# Patient Record
Sex: Female | Born: 1955 | Race: Black or African American | Hispanic: No | Marital: Single | Smoking: Former smoker
Health system: Southern US, Community
[De-identification: ages and names within clinical notes are randomized; demographics above are authoritative.]

## PROBLEM LIST (undated history)

## (undated) DIAGNOSIS — I10 Essential (primary) hypertension: Secondary | ICD-10-CM

## (undated) DIAGNOSIS — F341 Dysthymic disorder: Secondary | ICD-10-CM

## (undated) DIAGNOSIS — D1739 Benign lipomatous neoplasm of skin and subcutaneous tissue of other sites: Secondary | ICD-10-CM

## (undated) DIAGNOSIS — E8881 Metabolic syndrome: Secondary | ICD-10-CM

## (undated) DIAGNOSIS — F419 Anxiety disorder, unspecified: Secondary | ICD-10-CM

## (undated) DIAGNOSIS — E785 Hyperlipidemia, unspecified: Secondary | ICD-10-CM

## (undated) DIAGNOSIS — L989 Disorder of the skin and subcutaneous tissue, unspecified: Secondary | ICD-10-CM

## (undated) DIAGNOSIS — I471 Supraventricular tachycardia, unspecified: Secondary | ICD-10-CM

## (undated) DIAGNOSIS — E669 Obesity, unspecified: Secondary | ICD-10-CM

## (undated) DIAGNOSIS — M79609 Pain in unspecified limb: Secondary | ICD-10-CM

## (undated) DIAGNOSIS — G4700 Insomnia, unspecified: Secondary | ICD-10-CM

## (undated) DIAGNOSIS — IMO0001 Reserved for inherently not codable concepts without codable children: Secondary | ICD-10-CM

## (undated) DIAGNOSIS — M949 Disorder of cartilage, unspecified: Secondary | ICD-10-CM

## (undated) DIAGNOSIS — A6009 Herpesviral infection of other urogenital tract: Secondary | ICD-10-CM

## (undated) DIAGNOSIS — E559 Vitamin D deficiency, unspecified: Secondary | ICD-10-CM

## (undated) DIAGNOSIS — R9431 Abnormal electrocardiogram [ECG] [EKG]: Secondary | ICD-10-CM

## (undated) DIAGNOSIS — R062 Wheezing: Secondary | ICD-10-CM

## (undated) DIAGNOSIS — M899 Disorder of bone, unspecified: Secondary | ICD-10-CM

## (undated) DIAGNOSIS — J309 Allergic rhinitis, unspecified: Secondary | ICD-10-CM

## (undated) DIAGNOSIS — F17200 Nicotine dependence, unspecified, uncomplicated: Secondary | ICD-10-CM

## (undated) HISTORY — DX: Supraventricular tachycardia, unspecified: I47.10

## (undated) HISTORY — DX: Metabolic syndrome: E88.81

## (undated) HISTORY — DX: Essential (primary) hypertension: I10

## (undated) HISTORY — DX: Supraventricular tachycardia: I47.1

## (undated) HISTORY — DX: Disorder of bone, unspecified: M89.9

## (undated) HISTORY — DX: Hyperlipidemia, unspecified: E78.5

## (undated) HISTORY — DX: Reserved for inherently not codable concepts without codable children: IMO0001

## (undated) HISTORY — DX: Metabolic syndrome: E88.810

## (undated) HISTORY — DX: Disorder of the skin and subcutaneous tissue, unspecified: L98.9

## (undated) HISTORY — DX: Disorder of cartilage, unspecified: M94.9

## (undated) HISTORY — DX: Obesity, unspecified: E66.9

## (undated) HISTORY — DX: Wheezing: R06.2

## (undated) HISTORY — PX: ABDOMINAL HYSTERECTOMY: SHX81

## (undated) HISTORY — DX: Herpesviral infection of other urogenital tract: A60.09

## (undated) HISTORY — DX: Insomnia, unspecified: G47.00

## (undated) HISTORY — DX: Benign lipomatous neoplasm of skin and subcutaneous tissue of other sites: D17.39

## (undated) HISTORY — DX: Nicotine dependence, unspecified, uncomplicated: F17.200

## (undated) HISTORY — DX: Pain in unspecified limb: M79.609

## (undated) HISTORY — DX: Vitamin D deficiency, unspecified: E55.9

## (undated) HISTORY — DX: Dysthymic disorder: F34.1

## (undated) HISTORY — DX: Anxiety disorder, unspecified: F41.9

## (undated) HISTORY — DX: Allergic rhinitis, unspecified: J30.9

## (undated) HISTORY — DX: Abnormal electrocardiogram (ECG) (EKG): R94.31

## (undated) HISTORY — PX: TOTAL ABDOMINAL HYSTERECTOMY W/ BILATERAL SALPINGOOPHORECTOMY: SHX83

---

## 2008-07-30 ENCOUNTER — Ambulatory Visit: Payer: Self-pay | Admitting: Family Medicine

## 2009-08-10 DIAGNOSIS — E559 Vitamin D deficiency, unspecified: Secondary | ICD-10-CM

## 2009-09-14 ENCOUNTER — Ambulatory Visit: Payer: Self-pay | Admitting: Family Medicine

## 2009-09-17 ENCOUNTER — Ambulatory Visit: Payer: Self-pay | Admitting: Family Medicine

## 2009-12-22 LAB — HM DEXA SCAN

## 2010-03-21 LAB — HM COLONOSCOPY: HM Colonoscopy: NORMAL

## 2010-04-07 ENCOUNTER — Ambulatory Visit: Payer: Self-pay | Admitting: Gastroenterology

## 2010-04-29 ENCOUNTER — Ambulatory Visit: Payer: Self-pay | Admitting: Family Medicine

## 2010-10-06 ENCOUNTER — Ambulatory Visit: Payer: Self-pay | Admitting: Family Medicine

## 2012-05-01 ENCOUNTER — Ambulatory Visit: Payer: Self-pay | Admitting: Family Medicine

## 2013-07-18 ENCOUNTER — Ambulatory Visit: Payer: Self-pay | Admitting: Family Medicine

## 2014-03-17 ENCOUNTER — Emergency Department: Payer: Self-pay | Admitting: Emergency Medicine

## 2014-03-17 LAB — COMPREHENSIVE METABOLIC PANEL
ALT: 28 U/L (ref 12–78)
AST: 27 U/L (ref 15–37)
Albumin: 3.8 g/dL (ref 3.4–5.0)
Alkaline Phosphatase: 48 U/L
Anion Gap: 7 (ref 7–16)
BUN: 19 mg/dL — AB (ref 7–18)
Bilirubin,Total: 0.5 mg/dL (ref 0.2–1.0)
Calcium, Total: 9 mg/dL (ref 8.5–10.1)
Chloride: 110 mmol/L — ABNORMAL HIGH (ref 98–107)
Co2: 24 mmol/L (ref 21–32)
Creatinine: 1.34 mg/dL — ABNORMAL HIGH (ref 0.60–1.30)
EGFR (African American): 52 — ABNORMAL LOW
EGFR (Non-African Amer.): 44 — ABNORMAL LOW
GLUCOSE: 92 mg/dL (ref 65–99)
OSMOLALITY: 283 (ref 275–301)
Potassium: 4 mmol/L (ref 3.5–5.1)
Sodium: 141 mmol/L (ref 136–145)
Total Protein: 7.1 g/dL (ref 6.4–8.2)

## 2014-03-17 LAB — CK TOTAL AND CKMB (NOT AT ARMC)
CK, TOTAL: 168 U/L
CK-MB: 1.3 ng/mL (ref 0.5–3.6)

## 2014-03-17 LAB — CBC
HCT: 42.8 % (ref 35.0–47.0)
HGB: 14 g/dL (ref 12.0–16.0)
MCH: 28.4 pg (ref 26.0–34.0)
MCHC: 32.7 g/dL (ref 32.0–36.0)
MCV: 87 fL (ref 80–100)
PLATELETS: 227 10*3/uL (ref 150–440)
RBC: 4.94 10*6/uL (ref 3.80–5.20)
RDW: 14.6 % — AB (ref 11.5–14.5)
WBC: 6.6 10*3/uL (ref 3.6–11.0)

## 2014-03-17 LAB — TROPONIN I
TROPONIN-I: 0.05 ng/mL
Troponin-I: 0.09 ng/mL — ABNORMAL HIGH

## 2014-03-18 ENCOUNTER — Encounter: Payer: Self-pay | Admitting: *Deleted

## 2014-03-18 ENCOUNTER — Encounter (INDEPENDENT_AMBULATORY_CARE_PROVIDER_SITE_OTHER): Payer: Self-pay

## 2014-03-18 ENCOUNTER — Encounter: Payer: Self-pay | Admitting: Cardiovascular Disease

## 2014-03-18 ENCOUNTER — Ambulatory Visit (INDEPENDENT_AMBULATORY_CARE_PROVIDER_SITE_OTHER): Payer: No Typology Code available for payment source | Admitting: Cardiovascular Disease

## 2014-03-18 VITALS — BP 116/78 | HR 70 | Ht 69.0 in | Wt 214.5 lb

## 2014-03-18 DIAGNOSIS — R079 Chest pain, unspecified: Secondary | ICD-10-CM

## 2014-03-18 DIAGNOSIS — I10 Essential (primary) hypertension: Secondary | ICD-10-CM

## 2014-03-18 DIAGNOSIS — I498 Other specified cardiac arrhythmias: Secondary | ICD-10-CM

## 2014-03-18 DIAGNOSIS — I471 Supraventricular tachycardia, unspecified: Secondary | ICD-10-CM

## 2014-03-18 MED ORDER — ATORVASTATIN CALCIUM 40 MG PO TABS: 20.0000 mg | ORAL_TABLET | Freq: Every day | ORAL | Status: DC

## 2014-03-18 MED ORDER — OLMESARTAN MEDOXOMIL-HCTZ 20-12.5 MG PO TABS: 1.0000 | ORAL_TABLET | Freq: Every day | ORAL | Status: DC

## 2014-03-18 MED ORDER — DILTIAZEM HCL ER COATED BEADS 120 MG PO CP24: 120.0000 mg | ORAL_CAPSULE | Freq: Every day | ORAL | Status: DC

## 2014-03-18 NOTE — Assessment & Plan Note (Signed)
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 I discussed the natural history and management. We have to make sure there is no evidence of structural heart disease. Thus, I ordered an echocardiogram. Given that this is her first episode, it is reasonable to try medical therapy. If she fails medications, catheter ablation can be considered. I also explained vagal maneuvers to her today to terminate the tachycardia if it happens. In terms of medications, I recommend diltiazem extended release 120 mg once daily.

## 2014-03-18 NOTE — Assessment & Plan Note (Signed)
Diltiazem will be added for SVT as outlined above. Thus, I discontinued amlodipine so that she is not on 2 calcium channel blockers .

## 2014-03-18 NOTE — Patient Instructions (Addendum)
Your physician recommends that you schedule a follow-up appointment in:  1 month   Your physician has recommended you make the following change in your medication:  Stop Tribenzor  Start Benicar HCT 20/12.5 Start Diltiazem 120 mg daily  Decrease Atorvastatin to 20 mg daily   Your physician has requested that you have an echocardiogram. Echocardiography is a painless test that uses sound waves to create images of your heart. It provides your doctor with information about the size and shape of your heart and how well your heart's chambers and valves are working. This procedure takes approximately one hour. There are no restrictions for this procedure.

## 2014-03-18 NOTE — Progress Notes (Signed)
Primary care physician: Dr. Ancil Boozer  HPI  This is a pleasant 58 year old female who was referred for evaluation of supraventricular tachycardia. She has known history of hypertension and hyperlipidemia. Yesterday in the morning after she woke up, she started having palpitations associated with fatigue, sweating and dizziness. She went to work and from there she was directed to seek medical attention. She saw Dr. Ancil Boozer and was found to have a heart rate of 170 beats per minute. EKG showed supraventricular tachycardia. EMS were called. The patient was given adenosine in clinic and converted to sinus rhythm. She was taken to the emergency room at [facility]. Labs showed a troponin of 0.09 which was borderline. Basic metabolic profile showed a creatinine of 1.34 and BUN of 19. Electrolytes were unremarkable. CBC was normal. Chest x-ray shows no acute abnormalities. She reports no previous episodes of tachycardia or documented arrhythmia. She denies any exertional chest pain or shortness of breath prior to this episode. No orthopnea, PND or lower extremity edema. She is a smoker. No excessive caffeine use. No family history of premature coronary artery disease or sudden death.  Allergies  Allergen Reactions  . Acetaminophen      No current outpatient prescriptions on file prior to visit.   No current facility-administered medications on file prior to visit.     Past Medical History  Diagnosis Date  . Other and unspecified hyperlipidemia   . Dysmetabolic syndrome X   . Pain in limb   . Dysthymic disorder   . Essential hypertension, benign   . Insomnia, unspecified   . Allergic rhinitis, cause unspecified   . Unspecified disorder of skin and subcutaneous tissue   . Wheezing   . Obesity, unspecified   . Tobacco use disorder   . Myalgia and myositis, unspecified   . Lipoma of other skin and subcutaneous tissue   . Unspecified vitamin D deficiency   . Disorder of bone and cartilage, unspecified    . Nonspecific abnormal electrocardiogram (ECG) (EKG)   . Heart murmur   . SVT (supraventricular tachycardia)      Past Surgical History  Procedure Laterality Date  . Total abdominal hysterectomy w/ bilateral salpingoophorectomy       Family History  Problem Relation Age of Onset  . Breast cancer Mother   . Hyperlipidemia Mother      History   Social History  . Marital Status: Single    Spouse Name: N/A    Number of Children: N/A  . Years of Education: N/A   Occupational History  . Not on file.   Social History Main Topics  . Smoking status: Current Every Day Smoker -- 0.25 packs/day for 4 years  . Smokeless tobacco: Not on file  . Alcohol Use: Yes     Comment: occasional  . Drug Use: No  . Sexual Activity: Not on file   Other Topics Concern  . Not on file   Social History Narrative  . No narrative on file     ROS A 10 point review of system was performed. It is negative other than that mentioned in the history of present illness.   PHYSICAL EXAM   BP 116/78  Pulse 70  Ht 5\' 9"  (1.753 m)  Wt 214 lb 8 oz (97.297 kg)  BMI 31.66 kg/m2 Constitutional: She is oriented to person, place, and time. She appears well-developed and well-nourished. No distress.  HENT: No nasal discharge.  Head: Normocephalic and atraumatic.  Eyes: Pupils are equal and round. No discharge.  Neck: Normal range of motion. Neck supple. No JVD present. No thyromegaly present.  Cardiovascular: Normal rate, regular rhythm, normal heart sounds. Exam reveals no gallop and no friction rub. No murmur heard.  Pulmonary/Chest: Effort normal and breath sounds normal. No stridor. No respiratory distress. She has no wheezes. She has no rales. She exhibits no tenderness.  Abdominal: Soft. Bowel sounds are normal. She exhibits no distension. There is no tenderness. There is no rebound and no guarding.  Musculoskeletal: Normal range of motion. She exhibits no edema and no tenderness.    Neurological: She is alert and oriented to person, place, and time. Coordination normal.  Skin: Skin is warm and dry. No rash noted. She is not diaphoretic. No erythema. No pallor.  Psychiatric: She has a normal mood and affect. Her behavior is normal. Judgment and thought content normal.     EKG: Normal sinus rhythm with nonspecific T wave changes.   ASSESSMENT AND PLAN

## 2014-03-21 ENCOUNTER — Telehealth: Payer: Self-pay | Admitting: *Deleted

## 2014-03-21 NOTE — Telephone Encounter (Signed)
Patient called to make sure her new rx was sent to [facility]  Confirmed RX was sent  Patient asked for Benicar discount card to be placed at front desk

## 2014-03-24 ENCOUNTER — Other Ambulatory Visit: Payer: Self-pay

## 2014-03-24 ENCOUNTER — Telehealth: Payer: Self-pay

## 2014-03-24 MED ORDER — METOPROLOL TARTRATE 25 MG PO TABS: 25.0000 mg | ORAL_TABLET | Freq: Two times a day (BID) | ORAL | Status: DC

## 2014-03-24 MED ORDER — DILTIAZEM HCL ER COATED BEADS 120 MG PO CP24: 120.0000 mg | ORAL_CAPSULE | Freq: Every day | ORAL | Status: DC

## 2014-03-24 NOTE — Telephone Encounter (Signed)
Informed patient that her Diltiazem order was resent to [facility].

## 2014-03-24 NOTE — Telephone Encounter (Signed)
Pt called and states she still has not gotten her medication Dialtiazem. Please call.

## 2014-03-24 NOTE — Telephone Encounter (Signed)
New Rx metoprolol tart 25 mg take one tablet twice a day.

## 2014-03-25 ENCOUNTER — Telehealth: Payer: Self-pay

## 2014-03-25 ENCOUNTER — Other Ambulatory Visit (INDEPENDENT_AMBULATORY_CARE_PROVIDER_SITE_OTHER): Payer: No Typology Code available for payment source

## 2014-03-25 ENCOUNTER — Other Ambulatory Visit: Payer: Self-pay

## 2014-03-25 DIAGNOSIS — I471 Supraventricular tachycardia, unspecified: Secondary | ICD-10-CM

## 2014-03-25 DIAGNOSIS — R0602 Shortness of breath: Secondary | ICD-10-CM

## 2014-03-25 DIAGNOSIS — R079 Chest pain, unspecified: Secondary | ICD-10-CM

## 2014-03-25 NOTE — Telephone Encounter (Signed)
Spoke with patient regarding the change in her diltiazem since she can't swallow the diltiazem capsule. Dr. Fletcher Anon would like the patient to stop the diltiazem and start on Metoprolol Tart 25 mg take one tablet twice a day. A new Rx was sent to her pharmacy for metoprolol tart 25 mg take one tablet twice a day. The patient understands and will follow the instructions given regarding her medications.

## 2014-04-15 ENCOUNTER — Encounter: Payer: Self-pay | Admitting: Cardiovascular Disease

## 2014-04-15 ENCOUNTER — Ambulatory Visit (INDEPENDENT_AMBULATORY_CARE_PROVIDER_SITE_OTHER): Payer: No Typology Code available for payment source | Admitting: Cardiovascular Disease

## 2014-04-15 VITALS — BP 132/84 | HR 64 | Ht 69.0 in | Wt 215.5 lb

## 2014-04-15 DIAGNOSIS — E785 Hyperlipidemia, unspecified: Secondary | ICD-10-CM | POA: Insufficient documentation

## 2014-04-15 DIAGNOSIS — I10 Essential (primary) hypertension: Secondary | ICD-10-CM

## 2014-04-15 DIAGNOSIS — I471 Supraventricular tachycardia: Secondary | ICD-10-CM

## 2014-04-15 MED ORDER — ATORVASTATIN CALCIUM 20 MG PO TABS: 20.0000 mg | ORAL_TABLET | Freq: Every day | ORAL | Status: DC

## 2014-04-15 NOTE — Assessment & Plan Note (Signed)
Blood pressure is reasonably controlled on current medications. 

## 2014-04-15 NOTE — Assessment & Plan Note (Signed)
Continue treatment with metoprolol 25 mg twice daily. She is complaining of fatigue. If this continues to be an issue, we can consider decreasing the dose to 12.5 mg twice daily. If she develops recurrent episodes of SVT, catheter ablation is recommended.

## 2014-04-15 NOTE — Progress Notes (Signed)
Primary care physician: Dr. Ancil Boozer  HPI  This is a pleasant 58 year old female who is here today for followup visit regarding paroxysmal  supraventricular tachycardia. She has known history of hypertension and hyperlipidemia. She was seen recently for a documented episode of supraventricular tachycardia. She was started on diltiazem extended release but could not swallow a capsule. Thus, I switched her to metoprolol tartrate 25 mg twice daily. An echocardiogram was done which showed normal LV systolic function with mild grade 1 diastolic dysfunction. No significant valvular abnormalities. She has been doing well and denies any further episodes of tachycardia. No chest discomfort. She does complain of fatigue with metoprolol.  Allergies  Allergen Reactions  . Acetaminophen      Current Outpatient Prescriptions on File Prior to Visit  Medication Sig Dispense Refill  . ALPRAZolam (XANAX) 0.5 MG tablet Take 0.5 mg by mouth at bedtime as needed for anxiety.      Marland Kitchen buPROPion (WELLBUTRIN XL) 150 MG 24 hr tablet Take 150 mg by mouth daily.      . metoprolol tartrate (LOPRESSOR) 25 MG tablet Take 1 tablet (25 mg total) by mouth 2 (two) times daily.  180 tablet  3  . naproxen sodium (ANAPROX) 220 MG tablet Take 220 mg by mouth as needed.      Marland Kitchen olmesartan-hydrochlorothiazide (BENICAR HCT) 20-12.5 MG per tablet Take 1 tablet by mouth daily.  90 tablet  3   No current facility-administered medications on file prior to visit.     Past Medical History  Diagnosis Date  . Other and unspecified hyperlipidemia   . Dysmetabolic syndrome X   . Pain in limb   . Dysthymic disorder   . Insomnia, unspecified   . Allergic rhinitis, cause unspecified   . Unspecified disorder of skin and subcutaneous tissue   . Wheezing   . Obesity, unspecified   . Tobacco use disorder   . Myalgia and myositis, unspecified   . Lipoma of other skin and subcutaneous tissue   . Unspecified vitamin D deficiency   .  Disorder of bone and cartilage, unspecified   . Nonspecific abnormal electrocardiogram (ECG) (EKG)   . SVT (supraventricular tachycardia)   . Essential hypertension, benign      Past Surgical History  Procedure Laterality Date  . Total abdominal hysterectomy w/ bilateral salpingoophorectomy       Family History  Problem Relation Age of Onset  . Breast cancer Mother   . Hyperlipidemia Mother      History   Social History  . Marital Status: Single    Spouse Name: N/A    Number of Children: N/A  . Years of Education: N/A   Occupational History  . Not on file.   Social History Main Topics  . Smoking status: Current Every Day Smoker -- 0.25 packs/day for 4 years  . Smokeless tobacco: Not on file  . Alcohol Use: Yes     Comment: occasional  . Drug Use: No  . Sexual Activity: Not on file   Other Topics Concern  . Not on file   Social History Narrative  . No narrative on file     ROS A 10 point review of system was performed. It is negative other than that mentioned in the history of present illness.   PHYSICAL EXAM   BP 132/84  Pulse 64  Ht 5\' 9"  (1.753 m)  Wt 215 lb 8 oz (97.75 kg)  BMI 31.81 kg/m2 Constitutional: She is oriented to person, place, and time.  She appears well-developed and well-nourished. No distress.  HENT: No nasal discharge.  Head: Normocephalic and atraumatic.  Eyes: Pupils are equal and round. No discharge.  Neck: Normal range of motion. Neck supple. No JVD present. No thyromegaly present.  Cardiovascular: Normal rate, regular rhythm, normal heart sounds. Exam reveals no gallop and no friction rub. No murmur heard.  Pulmonary/Chest: Effort normal and breath sounds normal. No stridor. No respiratory distress. She has no wheezes. She has no rales. She exhibits no tenderness.  Abdominal: Soft. Bowel sounds are normal. She exhibits no distension. There is no tenderness. There is no rebound and no guarding.  Musculoskeletal: Normal range of  motion. She exhibits no edema and no tenderness.  Neurological: She is alert and oriented to person, place, and time. Coordination normal.  Skin: Skin is warm and dry. No rash noted. She is not diaphoretic. No erythema. No pallor.  Psychiatric: She has a normal mood and affect. Her behavior is normal. Judgment and thought content normal.     EKG: Normal sinus rhythm with nonspecific T wave changes.   ASSESSMENT AND PLAN

## 2014-04-15 NOTE — Patient Instructions (Signed)
Your physician has recommended you make the following change in your medication:  Decrease Atorvastatin to 20 mg once daily  Your physician wants you to follow-up in: 6 months. You will receive a reminder letter in the mail two months in advance. If you don't receive a letter, please call our office to schedule the follow-up appointment.

## 2014-04-15 NOTE — Assessment & Plan Note (Signed)
She has been taking atorvastatin twice daily by mistake. This was changed to once daily.

## 2014-04-17 ENCOUNTER — Ambulatory Visit: Payer: No Typology Code available for payment source | Admitting: Cardiovascular Disease

## 2014-07-09 LAB — HEMOGLOBIN A1C: Hgb A1c MFr Bld: 6 % (ref 4.0–6.0)

## 2014-07-09 LAB — LIPID PANEL
Cholesterol: 143 mg/dL (ref 0–200)
HDL: 48 mg/dL (ref 35–70)
LDL CALC: 81 mg/dL
Triglycerides: 71 mg/dL (ref 40–160)

## 2014-08-21 ENCOUNTER — Ambulatory Visit: Payer: Self-pay | Admitting: Family Medicine

## 2014-08-21 LAB — HM MAMMOGRAPHY: HM MAMMO: NORMAL

## 2014-08-27 ENCOUNTER — Ambulatory Visit: Payer: Self-pay | Admitting: Family Medicine

## 2014-10-09 ENCOUNTER — Ambulatory Visit (INDEPENDENT_AMBULATORY_CARE_PROVIDER_SITE_OTHER): Payer: No Typology Code available for payment source | Admitting: Cardiovascular Disease

## 2014-10-09 ENCOUNTER — Encounter: Payer: Self-pay | Admitting: Cardiovascular Disease

## 2014-10-09 VITALS — BP 112/76 | HR 52 | Ht 69.0 in | Wt 211.4 lb

## 2014-10-09 DIAGNOSIS — I10 Essential (primary) hypertension: Secondary | ICD-10-CM

## 2014-10-09 DIAGNOSIS — I471 Supraventricular tachycardia, unspecified: Secondary | ICD-10-CM

## 2014-10-09 NOTE — Assessment & Plan Note (Signed)
Blood pressure is controlled on current medications. 

## 2014-10-09 NOTE — Progress Notes (Signed)
Primary care physician: Dr. Ancil Boozer  HPI  This is a pleasant 58 year old female who is here today for followup visit regarding paroxysmal supraventricular tachycardia. She has known history of hypertension and hyperlipidemia. She was seen in 02/2014 for a documented episode of supraventricular tachycardia. She was started on diltiazem extended release but could not swallow a capsule. Thus, I switched her to metoprolol tartrate 25 mg twice daily. An echocardiogram was done which showed normal LV systolic function with mild grade 1 diastolic dysfunction. No significant valvular abnormalities. She has been doing well and denies any further episodes of tachycardia. No chest discomfort.  Generalized fatigue that was described with metoprolol initially has improved.   Allergies  Allergen Reactions  . Acetaminophen      Current Outpatient Prescriptions on File Prior to Visit  Medication Sig Dispense Refill  . ALPRAZolam (XANAX) 0.5 MG tablet Take 0.5 mg by mouth at bedtime as needed for anxiety.    Marland Kitchen atorvastatin (LIPITOR) 20 MG tablet Take 1 tablet (20 mg total) by mouth daily. 90 tablet 3  . buPROPion (WELLBUTRIN XL) 150 MG 24 hr tablet Take 150 mg by mouth daily.    . metoprolol tartrate (LOPRESSOR) 25 MG tablet Take 1 tablet (25 mg total) by mouth 2 (two) times daily. 180 tablet 3  . olmesartan-hydrochlorothiazide (BENICAR HCT) 20-12.5 MG per tablet Take 1 tablet by mouth daily. 90 tablet 3   No current facility-administered medications on file prior to visit.     Past Medical History  Diagnosis Date  . Other and unspecified hyperlipidemia   . Dysmetabolic syndrome X   . Pain in limb   . Dysthymic disorder   . Insomnia, unspecified   . Allergic rhinitis, cause unspecified   . Unspecified disorder of skin and subcutaneous tissue   . Wheezing   . Obesity, unspecified   . Tobacco use disorder   . Myalgia and myositis, unspecified   . Lipoma of other skin and subcutaneous tissue   .  Unspecified vitamin D deficiency   . Disorder of bone and cartilage, unspecified   . Nonspecific abnormal electrocardiogram (ECG) (EKG)   . SVT (supraventricular tachycardia)   . Essential hypertension, benign      Past Surgical History  Procedure Laterality Date  . Total abdominal hysterectomy w/ bilateral salpingoophorectomy       Family History  Problem Relation Age of Onset  . Breast cancer Mother   . Hyperlipidemia Mother      History   Social History  . Marital Status: Single    Spouse Name: N/A    Number of Children: N/A  . Years of Education: N/A   Occupational History  . Not on file.   Social History Main Topics  . Smoking status: Current Every Day Smoker -- 0.25 packs/day for 4 years  . Smokeless tobacco: Not on file  . Alcohol Use: Yes     Comment: occasional  . Drug Use: No  . Sexual Activity: Not on file   Other Topics Concern  . Not on file   Social History Narrative     ROS A 10 point review of system was performed. It is negative other than that mentioned in the history of present illness.   PHYSICAL EXAM   BP 112/76 mmHg  Pulse 52  Ht 5\' 9"  (1.753 m)  Wt 211 lb 6.4 oz (95.89 kg)  BMI 31.20 kg/m2 Constitutional: She is oriented to person, place, and time. She appears well-developed and well-nourished. No distress.  HENT: No nasal discharge.  Head: Normocephalic and atraumatic.  Eyes: Pupils are equal and round. No discharge.  Neck: Normal range of motion. Neck supple. No JVD present. No thyromegaly present.  Cardiovascular: Normal rate, regular rhythm, normal heart sounds. Exam reveals no gallop and no friction rub. No murmur heard.  Pulmonary/Chest: Effort normal and breath sounds normal. No stridor. No respiratory distress. She has no wheezes. She has no rales. She exhibits no tenderness.  Abdominal: Soft. Bowel sounds are normal. She exhibits no distension. There is no tenderness. There is no rebound and no guarding.    Musculoskeletal: Normal range of motion. She exhibits no edema and no tenderness.  Neurological: She is alert and oriented to person, place, and time. Coordination normal.  Skin: Skin is warm and dry. No rash noted. She is not diaphoretic. No erythema. No pallor.  Psychiatric: She has a normal mood and affect. Her behavior is normal. Judgment and thought content normal.     EKG: Normal sinus rhythm with nonspecific T wave changes.   ASSESSMENT AND PLAN

## 2014-10-09 NOTE — Patient Instructions (Signed)
Continue same medications.   Your physician wants you to follow-up in: 1 year.  You will receive a reminder letter in the mail two months in advance. If you don't receive a letter, please call our office to schedule the follow-up appointment.  

## 2014-10-09 NOTE — Assessment & Plan Note (Signed)
She has not had any arrhythmia since she was placed on metoprolol. I recommend continuing current treatment.

## 2014-11-25 ENCOUNTER — Telehealth: Payer: Self-pay

## 2014-11-25 NOTE — Telephone Encounter (Signed)
Notified patient samples available of Benicar 20/12.5 mg

## 2014-11-25 NOTE — Telephone Encounter (Signed)
Pt would like samples of Benicar.

## 2015-03-12 ENCOUNTER — Telehealth: Payer: Self-pay | Admitting: *Deleted

## 2015-03-12 MED ORDER — LOSARTAN POTASSIUM-HCTZ 50-12.5 MG PO TABS: 1.0000 | ORAL_TABLET | Freq: Every day | ORAL | Status: DC

## 2015-03-12 NOTE — Telephone Encounter (Signed)
Currently do no have any samples of Benicar 20-12.5 mg available. Spoke with Drug Rep and they will not be able to bring samples at this time. They mentioned that it would be a week before we would be able to get those samples.  Pt was unable to refill medication due to cost. Please advise.

## 2015-03-12 NOTE — Telephone Encounter (Signed)
Pt os asking for samples for Benicar  She went for a refill and it was a bit high to get, she is working with insurance company to reduce the price but wanted to ask if we had any. Please let patient know.

## 2015-03-12 NOTE — Telephone Encounter (Signed)
Spoke w/ pt.  Advised her of Dr. Tyrell Antonio recommendation.  She is agreeable and will call back w/ any questions or concerns.

## 2015-03-12 NOTE — Telephone Encounter (Signed)
I recommend switching to Losartan/HCTZ 50 /12.5 mg once daily which is generic.

## 2015-04-10 ENCOUNTER — Other Ambulatory Visit: Payer: Self-pay | Admitting: Cardiovascular Disease

## 2015-04-26 IMAGING — MG MM DIGITAL SCREENING BILAT W/ CAD
1 series · 4 of 4 positions shown · non-contrast
Comparison: Previous exam(s)

CLINICAL DATA: Screening.

EXAM:
DIGITAL SCREENING BILATERAL MAMMOGRAM WITH CAD

[R CC · right · 4 of 4 slices shown]
[im 1/4]
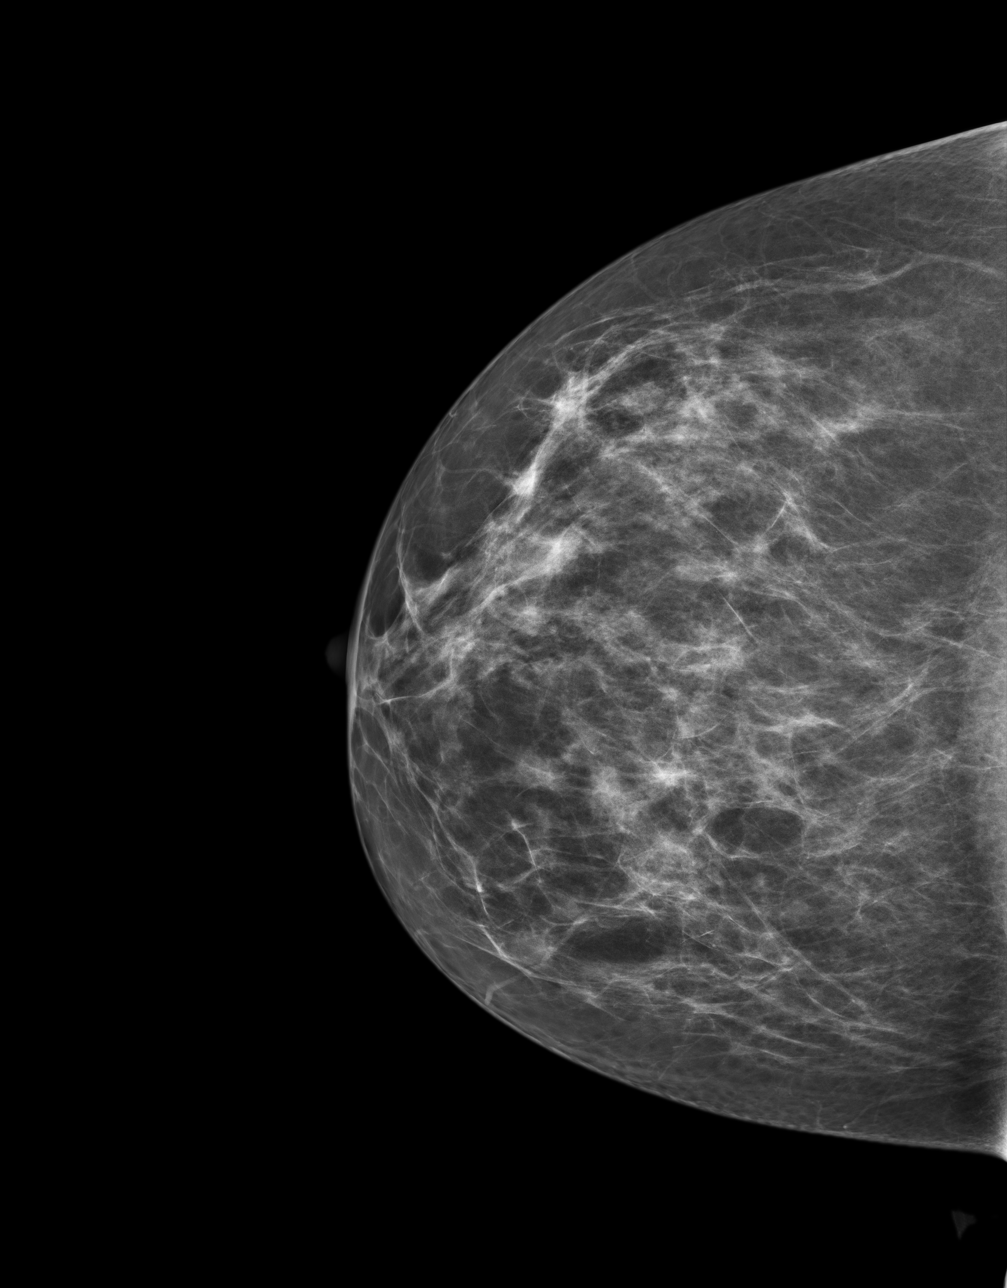
[im 2/4]
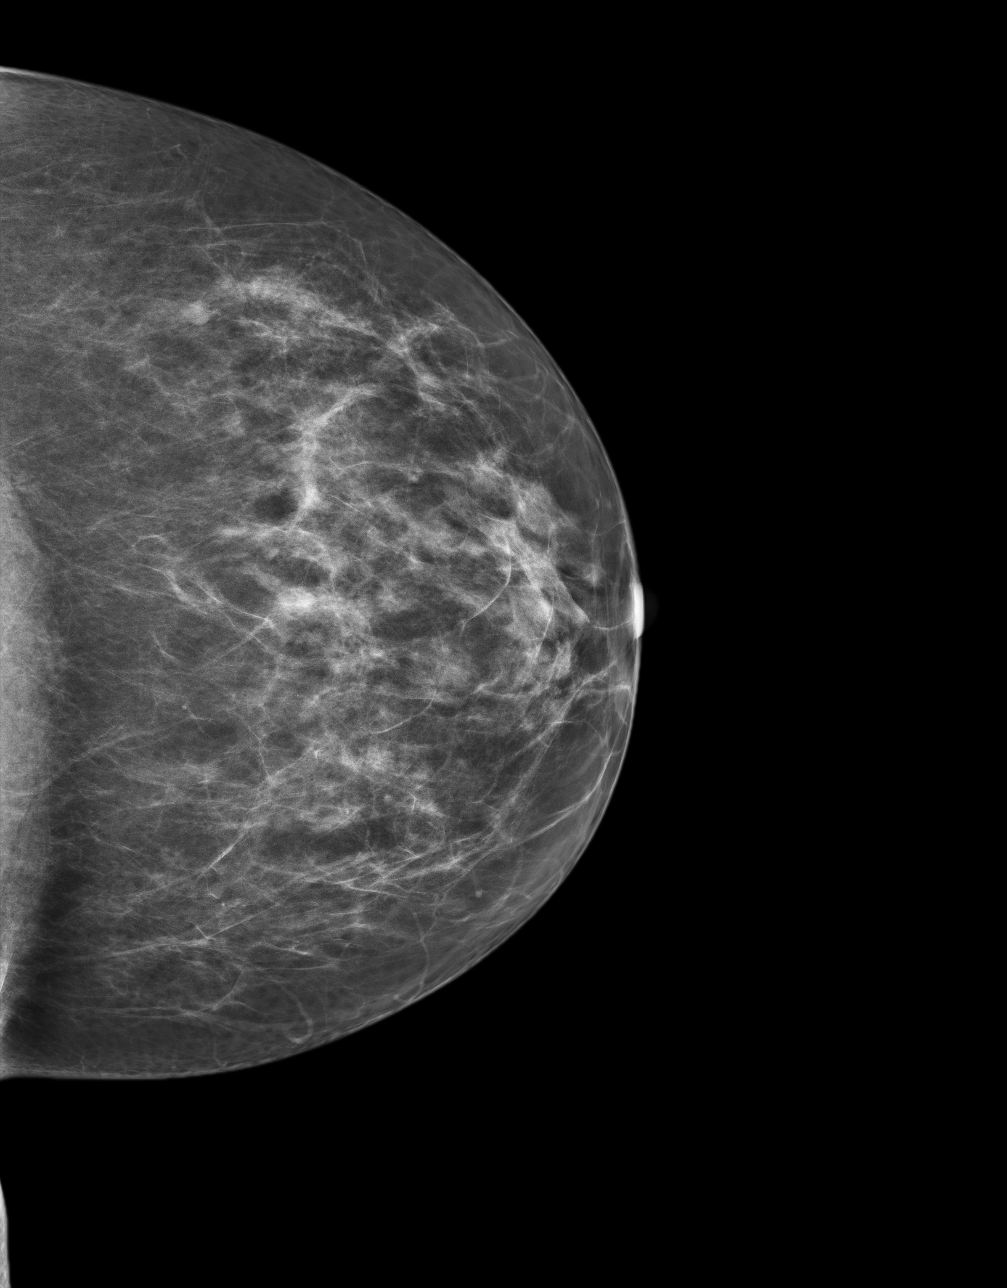
[im 3/4]
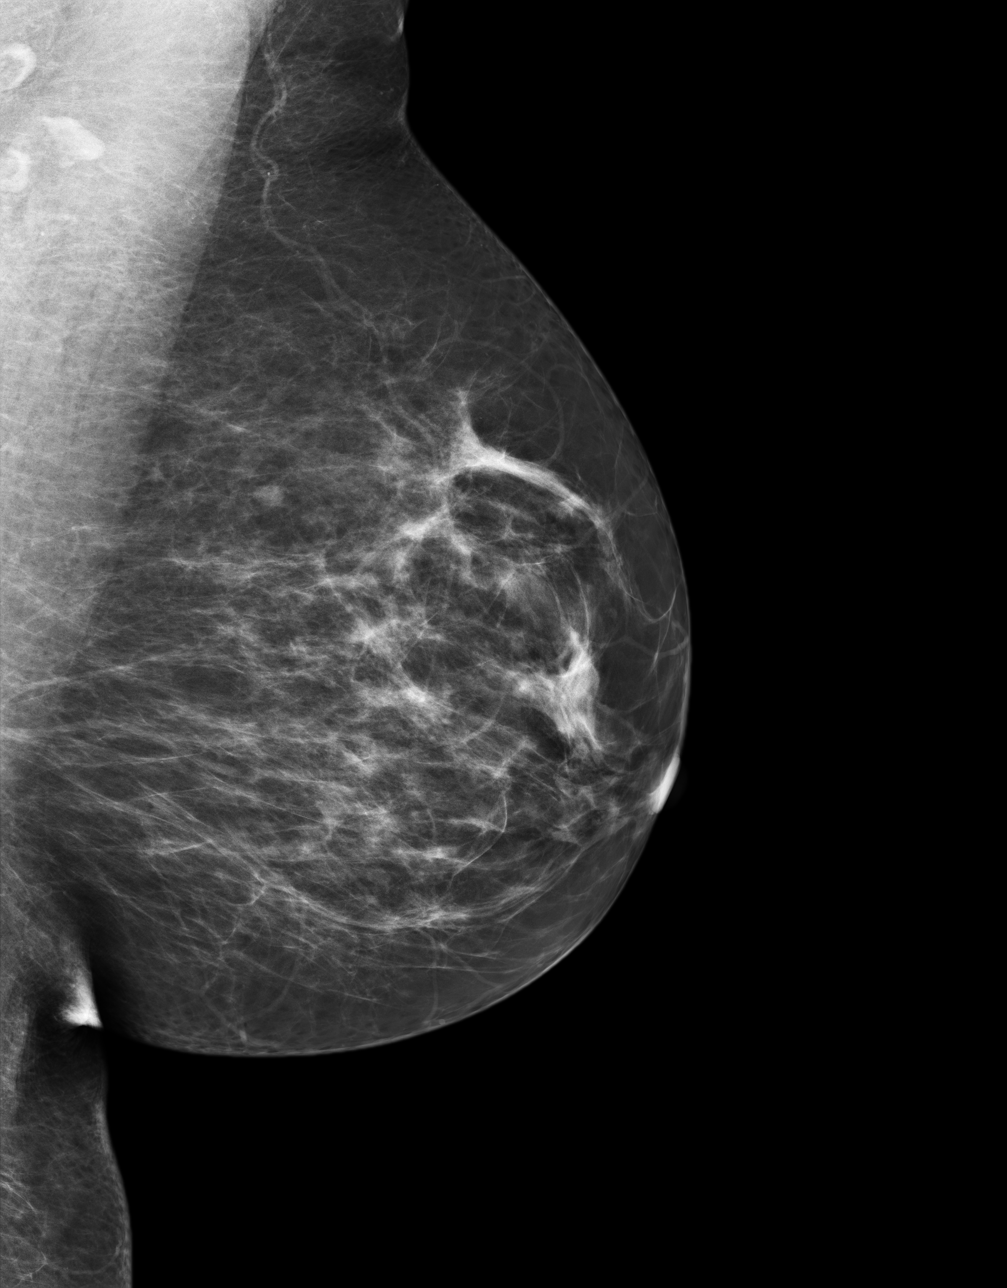
[im 4/4]
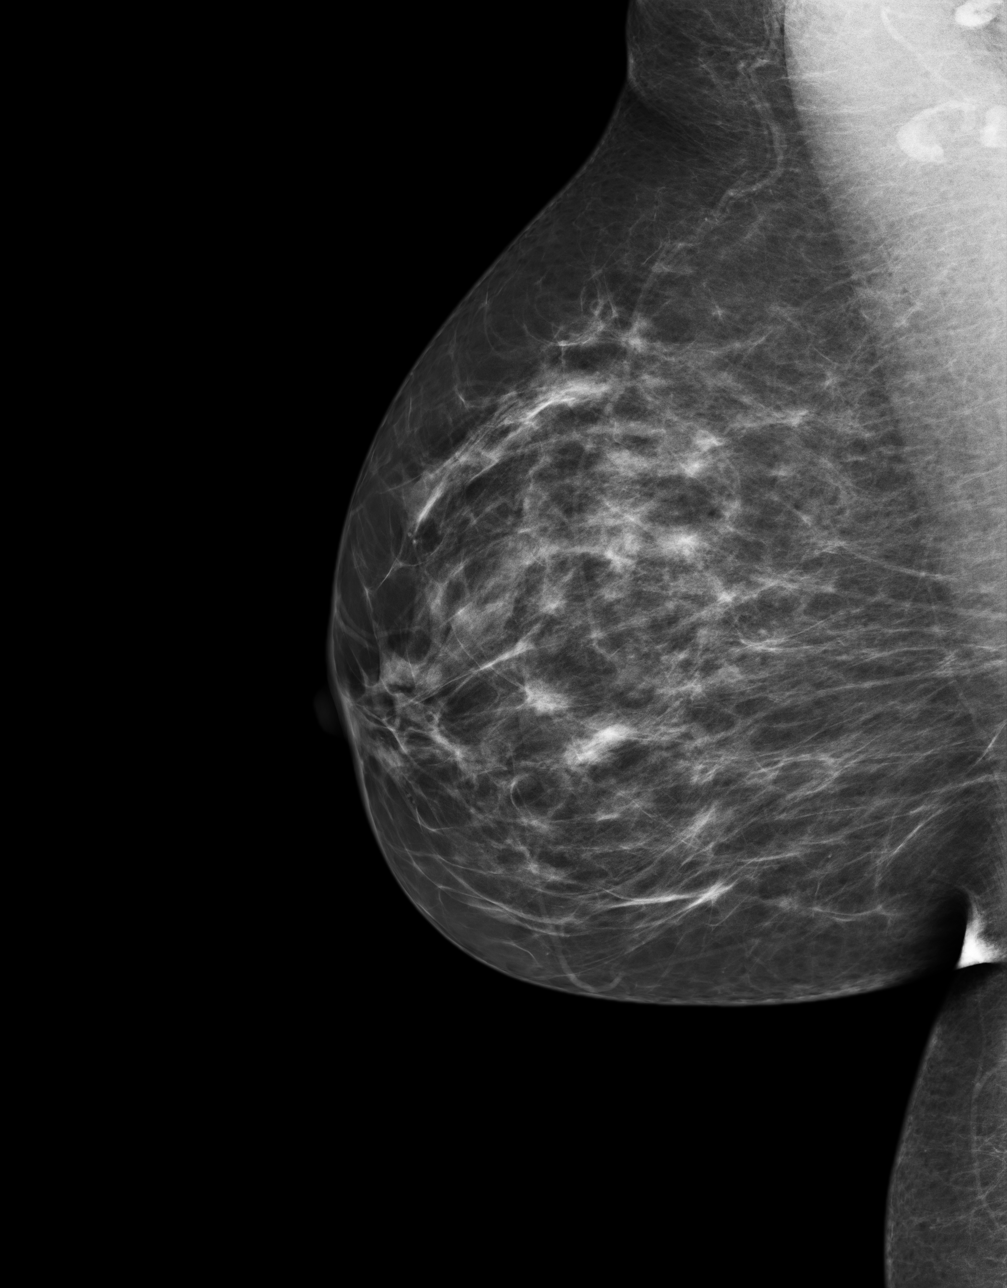

[4 of 4 positions shown; findings below may reference images not displayed]

ACR Breast Density Category b: There are scattered areas of
fibroglandular density.
FINDINGS: In the right breast, a possible mass warrants further evaluation
with spot compression views and possibly ultrasound. In the left
breast, no findings suspicious for malignancy. Images were processed
with CAD.
IMPRESSION: Further evaluation is suggested for possible mass in the right
breast.

RECOMMENDATION:
Diagnostic mammogram and possibly ultrasound of the right breast.
(Code:KG-2-55L)

The patient will be contacted regarding the findings, and additional
imaging will be scheduled.

BI-RADS CATEGORY  0: Incomplete. Need additional imaging evaluation
and/or prior mammograms for comparison.

## 2015-05-18 ENCOUNTER — Other Ambulatory Visit: Payer: Self-pay | Admitting: Family Medicine

## 2015-06-27 ENCOUNTER — Encounter: Payer: Self-pay | Admitting: Family Medicine

## 2015-06-27 DIAGNOSIS — M545 Low back pain, unspecified: Secondary | ICD-10-CM | POA: Insufficient documentation

## 2015-06-27 DIAGNOSIS — A6000 Herpesviral infection of urogenital system, unspecified: Secondary | ICD-10-CM | POA: Insufficient documentation

## 2015-06-27 DIAGNOSIS — I10 Essential (primary) hypertension: Secondary | ICD-10-CM | POA: Insufficient documentation

## 2015-06-27 DIAGNOSIS — R053 Chronic cough: Secondary | ICD-10-CM | POA: Insufficient documentation

## 2015-06-27 DIAGNOSIS — F329 Major depressive disorder, single episode, unspecified: Secondary | ICD-10-CM | POA: Insufficient documentation

## 2015-06-27 DIAGNOSIS — F419 Anxiety disorder, unspecified: Secondary | ICD-10-CM

## 2015-06-27 DIAGNOSIS — J309 Allergic rhinitis, unspecified: Secondary | ICD-10-CM | POA: Insufficient documentation

## 2015-06-27 DIAGNOSIS — E669 Obesity, unspecified: Secondary | ICD-10-CM | POA: Insufficient documentation

## 2015-06-27 DIAGNOSIS — L304 Erythema intertrigo: Secondary | ICD-10-CM | POA: Insufficient documentation

## 2015-06-27 DIAGNOSIS — M797 Fibromyalgia: Secondary | ICD-10-CM | POA: Insufficient documentation

## 2015-06-27 DIAGNOSIS — E8881 Metabolic syndrome: Secondary | ICD-10-CM | POA: Insufficient documentation

## 2015-06-27 DIAGNOSIS — Z87440 Personal history of urinary (tract) infections: Secondary | ICD-10-CM | POA: Insufficient documentation

## 2015-06-27 DIAGNOSIS — E785 Hyperlipidemia, unspecified: Secondary | ICD-10-CM | POA: Insufficient documentation

## 2015-06-27 DIAGNOSIS — R05 Cough: Secondary | ICD-10-CM | POA: Insufficient documentation

## 2015-06-27 DIAGNOSIS — G4700 Insomnia, unspecified: Secondary | ICD-10-CM | POA: Insufficient documentation

## 2015-06-27 DIAGNOSIS — R062 Wheezing: Secondary | ICD-10-CM | POA: Insufficient documentation

## 2015-07-02 ENCOUNTER — Ambulatory Visit: Payer: Self-pay | Admitting: Family Medicine

## 2015-07-02 ENCOUNTER — Other Ambulatory Visit: Payer: Self-pay | Admitting: Family Medicine

## 2015-07-02 NOTE — Telephone Encounter (Signed)
Patient requesting refill. 

## 2015-07-03 ENCOUNTER — Other Ambulatory Visit: Payer: Self-pay | Admitting: Family Medicine

## 2015-07-03 NOTE — Telephone Encounter (Signed)
Patient requesting refill. 

## 2015-07-03 NOTE — Telephone Encounter (Signed)
Patient will call back to sch the appointment

## 2015-08-21 ENCOUNTER — Telehealth: Payer: Self-pay

## 2015-08-21 NOTE — Telephone Encounter (Signed)
Notified patient we do not sample Losartan but I check to see if Dr. Rockey Situ can switch to another medication that is cheaper. Please advise if there is another medication she can try that would be cheaper than Losartan; the patient has no insurance at this time.

## 2015-08-21 NOTE — Telephone Encounter (Signed)
Pt states she has no insurance now and would like Losartan samples. Please call.

## 2015-08-24 NOTE — Telephone Encounter (Signed)
LMOM for patient to call back regarding medication change.

## 2015-08-24 NOTE — Telephone Encounter (Signed)
Will forward to Dr. Fletcher Anon

## 2015-08-24 NOTE — Telephone Encounter (Signed)
We can switch that to Lisinopril-HCTZ 20-12.5 mg daily . This is on the 4 $ list at [facility].

## 2015-08-25 ENCOUNTER — Other Ambulatory Visit: Payer: Self-pay | Admitting: *Deleted

## 2015-08-25 MED ORDER — LISINOPRIL-HYDROCHLOROTHIAZIDE 20-12.5 MG PO TABS: 1.0000 | ORAL_TABLET | Freq: Every day | ORAL | Status: AC

## 2015-08-25 NOTE — Telephone Encounter (Signed)
Pt would like to switch to Lisinopril HCTZ 20-12.5 MG #30 R#3 sent to local pharmacy.

## 2015-08-31 ENCOUNTER — Telehealth: Payer: Self-pay | Admitting: *Deleted

## 2015-08-31 NOTE — Telephone Encounter (Signed)
Left message on machine for patient to contact the office.   

## 2015-08-31 NOTE — Telephone Encounter (Signed)
Pt c/o medication issue:  1. Name of Medication:  Not sure what it is, can't remember changed from losartan to this new one.   2. How are you currently taking this medication (dosage and times per day)? She is stopped it since yesterday.   3. Are you having a reaction (difficulty breathing--STAT)? No   4. What is your medication issue?  Told by pharmacist that these side effect may happen  Only took medication 4 days   Coughing, dry mouth Chest hurting caused by coughing can't really sleep.

## 2015-09-04 NOTE — Telephone Encounter (Signed)
Left message on machine for patient to contact the office.   

## 2015-09-07 NOTE — Telephone Encounter (Signed)
Left message on machine for patient to contact the office.   

## 2015-10-05 ENCOUNTER — Ambulatory Visit: Payer: No Typology Code available for payment source | Attending: Oncology

## 2015-10-28 ENCOUNTER — Ambulatory Visit: Payer: Self-pay | Attending: Oncology | Admitting: *Deleted

## 2015-10-28 ENCOUNTER — Telehealth: Payer: Self-pay | Admitting: Cardiovascular Disease

## 2015-10-28 ENCOUNTER — Encounter: Payer: Self-pay | Admitting: *Deleted

## 2015-10-28 ENCOUNTER — Ambulatory Visit
Admission: RE | Admit: 2015-10-28 | Discharge: 2015-10-28 | Disposition: A | Discharge status: Home / Self Care | Payer: Self-pay | Source: Ambulatory Visit | Attending: Oncology | Admitting: Oncology

## 2015-10-28 VITALS — BP 140/88 | HR 61 | Temp 97.3°F | Ht 70.08 in | Wt 208.2 lb

## 2015-10-28 DIAGNOSIS — Z0000 Encounter for general adult medical examination without abnormal findings: Secondary | ICD-10-CM

## 2015-10-28 NOTE — Telephone Encounter (Signed)
3 attempts to schedule from recall list.  LMOV to call office.  Deleting recall.   °

## 2015-10-28 NOTE — Patient Instructions (Signed)
Gave patient hand-out, Women Staying Healthy, Active and Well from [facility], with education on breast health, pap smears, heart and colon health. 

## 2015-10-28 NOTE — Progress Notes (Signed)
Subjective:     Patient ID: Rhonda Burgess, female   DOB: 1956/08/05, 59 y.o.   MRN: [id number]  HPI   Review of Systems     Objective:   Physical Exam  Pulmonary/Chest: Right breast exhibits no inverted nipple, no mass, no nipple discharge, no skin change and no tenderness. Left breast exhibits no inverted nipple, no mass, no nipple discharge, no skin change and no tenderness. Breasts are symmetrical.  Abdominal: There is no splenomegaly or hepatomegaly.    Genitourinary: Rectal exam shows external hemorrhoid. No labial fusion. There is no rash, tenderness, lesion or injury on the right labia. There is no rash, tenderness or lesion on the left labia. No erythema or tenderness in the vagina. No foreign body around the vagina. No signs of injury around the vagina. No vaginal discharge found.  Soft brown stool noted on exam       Assessment:     59 year old 10 female presents to [facility] for clinical breast exam, pelvic exam and mammogram.  Clinical breast exam unremarkable.  Taught self breast awareness.  Pelvic exam without masses.  Patient has been screened for eligibility.  She does not have any insurance, Medicare or Medicaid.  She also meets financial eligibility.  Hand-out given on the Affordable Care Act.     Plan:     Screening mammogram ordered.  Will follow-up per [facility] protocol.

## 2015-10-29 ENCOUNTER — Encounter: Payer: Self-pay | Admitting: *Deleted

## 2015-10-29 NOTE — Progress Notes (Signed)
Letter mailed from [facility] to inform patient of her normal mammogram results.  Patient is to follow-up with annual screening in one year.  HSIS to [facility]. 

## 2018-08-27 ENCOUNTER — Encounter (INDEPENDENT_AMBULATORY_CARE_PROVIDER_SITE_OTHER): Payer: Self-pay

## 2018-08-27 ENCOUNTER — Ambulatory Visit
Admission: RE | Admit: 2018-08-27 | Discharge: 2018-08-27 | Disposition: A | Discharge status: Home / Self Care | Payer: Self-pay | Source: Ambulatory Visit | Attending: Oncology | Admitting: Oncology

## 2018-08-27 ENCOUNTER — Encounter: Payer: Self-pay | Admitting: *Deleted

## 2018-08-27 ENCOUNTER — Ambulatory Visit: Payer: Self-pay | Attending: Oncology | Admitting: *Deleted

## 2018-08-27 VITALS — BP 113/75 | HR 80 | Temp 97.1°F | Ht 73.0 in | Wt 243.0 lb

## 2018-08-27 DIAGNOSIS — Z0000 Encounter for general adult medical examination without abnormal findings: Secondary | ICD-10-CM | POA: Insufficient documentation

## 2018-08-27 NOTE — Patient Instructions (Signed)
Gave patient hand-out, Women Staying Healthy, Active and Well from [facility], with education on breast health, pap smears, heart and colon health. 

## 2018-08-27 NOTE — Progress Notes (Signed)
  Subjective:     Patient ID: Rhonda Burgess, female   DOB: Aug 28, 1956, 62 y.o.   MRN: [id number]  HPI   Review of Systems     Objective:   Physical Exam  Pulmonary/Chest: Right breast exhibits no inverted nipple, no mass, no nipple discharge, no skin change and no tenderness. Left breast exhibits no inverted nipple, no mass, no nipple discharge, no skin change and no tenderness. Breasts are asymmetrical.  Large pendulous breast.  Right breast larger than the left       Assessment:     62 year old Black female returns to [facility] for annual screening.  Clinical breast exam unremarkable.  Taught self breast awareness. Family history of breast cancer in her mam at age 38.  Patient with a history of hysterectomy for fibroids.  Patient has been screened for eligibility.  She does not have any insurance, Medicare or Medicaid.  She also meets financial eligibility.  Hand-out given on the Affordable Care Act.  Risk Assessment    Risk Scores      08/27/2018   Last edited by: Rico Junker, RN   5-year risk: 2.3 %   Lifetime risk: 10.8 %            Plan:     Screening mammogram ordered.  Will follow-up per B[facility]CP protocol.

## 2018-08-27 NOTE — Progress Notes (Signed)
Letter mailed from [facility] to inform patient of her normal mammogram results.  Patient is to follow-up with annual screening in one year.  HSIS to [facility]. 

## 2019-08-26 ENCOUNTER — Telehealth: Payer: Self-pay

## 2019-08-26 NOTE — Telephone Encounter (Signed)
Pre-screening call teempted prior to appointment with [facility] on 08/28/2019. No answer / left voicemail on mobile #.

## 2019-08-27 ENCOUNTER — Other Ambulatory Visit: Payer: Self-pay

## 2019-08-28 ENCOUNTER — Other Ambulatory Visit: Payer: Self-pay

## 2019-08-28 ENCOUNTER — Encounter: Payer: Self-pay | Admitting: *Deleted

## 2019-08-28 ENCOUNTER — Encounter (INDEPENDENT_AMBULATORY_CARE_PROVIDER_SITE_OTHER): Payer: Self-pay

## 2019-08-28 ENCOUNTER — Ambulatory Visit
Admission: RE | Admit: 2019-08-28 | Discharge: 2019-08-28 | Disposition: A | Discharge status: Home / Self Care | Payer: Self-pay | Source: Ambulatory Visit | Attending: Oncology | Admitting: Oncology

## 2019-08-28 ENCOUNTER — Ambulatory Visit: Payer: Self-pay | Attending: Oncology | Admitting: *Deleted

## 2019-08-28 VITALS — BP 152/86 | HR 56 | Temp 96.5°F | Ht 70.0 in | Wt 229.0 lb

## 2019-08-28 DIAGNOSIS — Z0000 Encounter for general adult medical examination without abnormal findings: Secondary | ICD-10-CM

## 2019-08-28 NOTE — Progress Notes (Signed)
  Subjective:     Patient ID: Rhonda Burgess, female   DOB: 1956-05-31, 63 y.o.   MRN: [id number]  HPI   Review of Systems     Objective:   Physical Exam Chest:     Breasts:        Right: No swelling, bleeding, inverted nipple, mass, nipple discharge, skin change or tenderness.        Left: No swelling, bleeding, inverted nipple, mass, nipple discharge, skin change or tenderness.  Lymphadenopathy:     Upper Body:     Right upper body: No supraclavicular or axillary adenopathy.     Left upper body: No supraclavicular or axillary adenopathy.        Assessment:     63 year old [place] American female returns to [facility] for annual screening.  Clinical breast exam unremarkable.  Taught self breast awareness.  Patient with a history of a hysterectomy for fibroids.  No pap smear per protocol.  Tyrer-Cuzick breast cancer risk assessment model with a score of 16.9% lifetime risk of breast cancer.  Patient encouraged to continue annual screenings.  Patient has been screened for eligibility.  She does not have any insurance, Medicare or Medicaid.  She also meets financial eligibility.  Hand-out given on the Affordable Care Act.     Plan:     Screening mammogram ordered.  Will follow-up per B[facility]CP protocol.

## 2019-08-28 NOTE — Progress Notes (Signed)
Letter mailed from [facility] to inform patient of her normal mammogram results.  Patient is to follow-up with annual screening in one year.  HSIS to [facility]. 

## 2019-08-28 NOTE — Patient Instructions (Signed)
Gave patient hand-out, Women Staying Healthy, Active and Well from [facility], with education on breast health, pap smears, heart and colon health. 

## 2019-08-28 NOTE — Progress Notes (Unsigned)
Letter mailed from the [facility] to inform patient of her normal mammogram results.  Patient is to follow-up with annual screening in one year.  HSIS to Christy. 

## 2019-12-23 ENCOUNTER — Ambulatory Visit: Payer: Self-pay | Attending: Internal Medicine

## 2019-12-23 DIAGNOSIS — Z20822 Contact with and (suspected) exposure to covid-19: Secondary | ICD-10-CM | POA: Insufficient documentation

## 2019-12-24 LAB — NOVEL CORONAVIRUS, NAA: SARS-CoV-2, NAA: NOT DETECTED

## 2020-02-20 ENCOUNTER — Ambulatory Visit: Payer: Self-pay | Attending: Internal Medicine

## 2020-02-20 DIAGNOSIS — Z23 Encounter for immunization: Secondary | ICD-10-CM

## 2020-02-20 NOTE — Progress Notes (Signed)
   Covid-19 Vaccination Clinic  Name:  Georganne Slavey    MRN: [id number] DOB: 03-29-1956  02/20/2020  Ms. Panuco was observed post Covid-19 immunization for 15 minutes without incident. She was provided with Vaccine Information Sheet and instruction to access the V-Safe system.   Ms. Bucio was instructed to call 911 with any severe reactions post vaccine: Marland Kitchen Difficulty breathing  . Swelling of face and throat  . A fast heartbeat  . A bad rash all over body  . Dizziness and weakness   Immunizations Administered    Name Date Dose VIS Date Route   Pfizer COVID-19 Vaccine 02/20/2020 10:48 AM 0.3 mL 11/01/2019 Intramuscular   Manufacturer: [place]   Lot: [id number]   [place]: [id number]

## 2020-03-17 ENCOUNTER — Ambulatory Visit: Payer: Self-pay | Attending: Internal Medicine

## 2020-03-17 DIAGNOSIS — Z23 Encounter for immunization: Secondary | ICD-10-CM

## 2020-03-17 NOTE — Progress Notes (Signed)
   Covid-19 Vaccination Clinic  Name:  Rhonda Burgess    MRN: [id number] DOB: 1956-10-25  03/17/2020  Rhonda Burgess was observed post Covid-19 immunization for 15 minutes without incident. She was provided with Vaccine Information Sheet and instruction to access the V-Safe system.   Rhonda Burgess was instructed to call 911 with any severe reactions post vaccine: Marland Kitchen Difficulty breathing  . Swelling of face and throat  . A fast heartbeat  . A bad rash all over body  . Dizziness and weakness   Immunizations Administered    Name Date Dose VIS Date Route   Pfizer COVID-19 Vaccine 03/17/2020 10:33 AM 0.3 mL 01/15/2019 Intramuscular   Manufacturer: [employer], [employer]   Lot: [id number]   [place]: [id number]

## 2020-09-01 ENCOUNTER — Ambulatory Visit: Payer: Self-pay

## 2020-09-15 ENCOUNTER — Ambulatory Visit
Admission: RE | Admit: 2020-09-15 | Discharge: 2020-09-15 | Disposition: A | Discharge status: Home / Self Care | Payer: Self-pay | Source: Ambulatory Visit | Attending: Oncology | Admitting: Oncology

## 2020-09-15 ENCOUNTER — Ambulatory Visit: Payer: Self-pay | Attending: Oncology | Admitting: *Deleted

## 2020-09-15 ENCOUNTER — Other Ambulatory Visit: Payer: Self-pay

## 2020-09-15 VITALS — BP 147/89 | HR 56 | Temp 98.3°F | Ht 69.0 in | Wt 203.0 lb

## 2020-09-15 DIAGNOSIS — Z0000 Encounter for general adult medical examination without abnormal findings: Secondary | ICD-10-CM

## 2020-09-15 NOTE — Patient Instructions (Signed)
Gave patient hand-out, Women Staying Healthy, Active and Well from [facility], with education on breast health, pap smears, heart and colon health. 

## 2020-09-15 NOTE — Progress Notes (Signed)
°  Subjective:     Patient ID: Rhonda Burgess, female   DOB: 1956-01-03, 64 y.o.   MRN: [id number]  HPI   BCCCP Medical History Record - 09/15/20 0934      Previous History of Breast Problems   Breast Surgery or Biopsy None    Breast Implants N/A    BSE Done Monthly      Gynecological/Obstetrical History   LMP --   hysterectomy   Is there any chance that the client could be pregnant?  No    Age at menarche 52    Age [age] menopause [age]    Date of last PAP  --   Hysterectomy in her 46's   Age at first live birth 41    Breast fed children Yes (type length in comments)    DES Exposure No    Cervical, Uterine or Ovarian cancer No    Family history of Cervial, Uterine or Ovarian cancer No    Hysterectomy Yes   64 yo   Cervix removed Yes    Ovaries removed No    Laser/Cryosurgery No    Current method of birth control --   hysterectomy   Current method of Estrogen/Hormone replacement None    Smoking history --   quit            Review of Systems     Objective:   Physical Exam Chest:     Breasts:        Right: No swelling, bleeding, inverted nipple, mass, nipple discharge, skin change or tenderness.        Left: No swelling, bleeding, inverted nipple, mass, nipple discharge, skin change or tenderness.  Lymphadenopathy:     Upper Body:     Right upper body: No supraclavicular or axillary adenopathy.     Left upper body: No supraclavicular or axillary adenopathy.        Assessment:     64 year old Black female returns to [facility] for annual screening.  Clinical breast exam unremarkable.  Taught self breast awareness.  Patient with a history of hysterectomy.  Pap deferred per protocol.  Patient has been screened for eligibility.  She does not have any insurance, Medicare or Medicaid.  She also meets financial eligibility.   Risk Assessment    Risk Scores      09/15/2020 08/28/2019   Last edited by: Theodore Demark, RN Theodore Demark, RN   5-year risk: 2.5 % 2.4 %   Lifetime  risk: 10.3 % 10.5 %            Plan:     Screening mammogram ordered.  Will follow up per B[facility]CP protocol.

## 2020-09-17 ENCOUNTER — Encounter: Payer: Self-pay | Admitting: *Deleted

## 2020-09-17 NOTE — Progress Notes (Signed)
Letter mailed from the [facility] to inform patient of her normal mammogram results.  Patient is to follow-up with annual screening in one year. 

## 2021-05-17 ENCOUNTER — Encounter: Payer: Self-pay | Admitting: Family Medicine

## 2021-10-13 ENCOUNTER — Other Ambulatory Visit: Payer: Self-pay | Admitting: Family Medicine

## 2021-10-13 DIAGNOSIS — Z1231 Encounter for screening mammogram for malignant neoplasm of breast: Secondary | ICD-10-CM

## 2021-10-19 ENCOUNTER — Ambulatory Visit
Admission: RE | Admit: 2021-10-19 | Discharge: 2021-10-19 | Disposition: A | Discharge status: Home / Self Care | Payer: BLUE CROSS/BLUE SHIELD | Source: Ambulatory Visit | Attending: Family Medicine | Admitting: Family Medicine

## 2021-10-19 ENCOUNTER — Other Ambulatory Visit: Payer: Self-pay

## 2021-10-19 DIAGNOSIS — Z1231 Encounter for screening mammogram for malignant neoplasm of breast: Secondary | ICD-10-CM | POA: Diagnosis present

## 2023-05-15 ENCOUNTER — Other Ambulatory Visit: Payer: Self-pay | Admitting: Family Medicine

## 2023-05-15 DIAGNOSIS — Z1231 Encounter for screening mammogram for malignant neoplasm of breast: Secondary | ICD-10-CM

## 2024-08-23 ENCOUNTER — Other Ambulatory Visit: Payer: Self-pay | Admitting: Gastroenterology

## 2024-08-23 DIAGNOSIS — R1084 Generalized abdominal pain: Secondary | ICD-10-CM

## 2024-09-03 ENCOUNTER — Ambulatory Visit: Payer: Self-pay

## 2024-09-03 DIAGNOSIS — Z1211 Encounter for screening for malignant neoplasm of colon: Secondary | ICD-10-CM | POA: Diagnosis present

## 2024-09-03 DIAGNOSIS — K640 First degree hemorrhoids: Secondary | ICD-10-CM | POA: Diagnosis not present

## 2024-09-23 ENCOUNTER — Ambulatory Visit
Admission: RE | Admit: 2024-09-23 | Discharge: 2024-09-23 | Disposition: A | Discharge status: Home / Self Care | Source: Ambulatory Visit | Attending: Gastroenterology | Admitting: Gastroenterology

## 2024-09-23 DIAGNOSIS — R1084 Generalized abdominal pain: Secondary | ICD-10-CM | POA: Diagnosis present

## 2024-09-23 MED ORDER — IOHEXOL 300 MG/ML  SOLN
100.0000 mL | Freq: Once | INTRAMUSCULAR | Status: AC | PRN
  Administered 2024-09-23: 100 mL via INTRAVENOUS

## 2024-10-25 ENCOUNTER — Other Ambulatory Visit: Payer: Self-pay | Admitting: Gastroenterology

## 2024-10-25 DIAGNOSIS — R1084 Generalized abdominal pain: Secondary | ICD-10-CM

## 2024-10-25 DIAGNOSIS — K769 Liver disease, unspecified: Secondary | ICD-10-CM
